# Patient Record
Sex: Female | Born: 1970 | Smoking: Never smoker
Health system: Southern US, Community
[De-identification: ages and names within clinical notes are randomized; demographics above are authoritative.]

## PROBLEM LIST (undated history)

## (undated) DIAGNOSIS — G4733 Obstructive sleep apnea (adult) (pediatric): Secondary | ICD-10-CM

## (undated) DIAGNOSIS — A6000 Herpesviral infection of urogenital system, unspecified: Secondary | ICD-10-CM

## (undated) HISTORY — DX: Obstructive sleep apnea (adult) (pediatric): G47.33

## (undated) HISTORY — PX: ABDOMINAL HYSTERECTOMY: SHX81

## (undated) HISTORY — DX: Herpesviral infection of urogenital system, unspecified: A60.00

---

## 2000-12-10 HISTORY — PX: ABLATION: SHX5711

## 2006-04-22 ENCOUNTER — Ambulatory Visit: Payer: Self-pay | Admitting: Obstetrics and Gynecology

## 2011-08-08 DIAGNOSIS — M545 Low back pain: Secondary | ICD-10-CM | POA: Insufficient documentation

## 2013-12-10 HISTORY — PX: GASTRIC BYPASS: SHX52

## 2014-04-28 ENCOUNTER — Ambulatory Visit: Payer: Self-pay | Admitting: Specialist

## 2014-04-28 DIAGNOSIS — E669 Obesity, unspecified: Secondary | ICD-10-CM

## 2014-04-28 DIAGNOSIS — Z01810 Encounter for preprocedural cardiovascular examination: Secondary | ICD-10-CM

## 2014-04-28 IMAGING — CR DG CHEST 2V
1 series · 2 of 2 positions shown · non-contrast
Comparison: None.

ADDENDUM:
In the clinical data line the term "more red" should be "morbid".
CLINICAL DATA: Pre bariatric procedure screening ; more red obesity

EXAM:
CHEST  2 VIEW

[Series 1: w chest pa · 0.14mm/px · 2 of 2 slices shown]
[im 1/2]
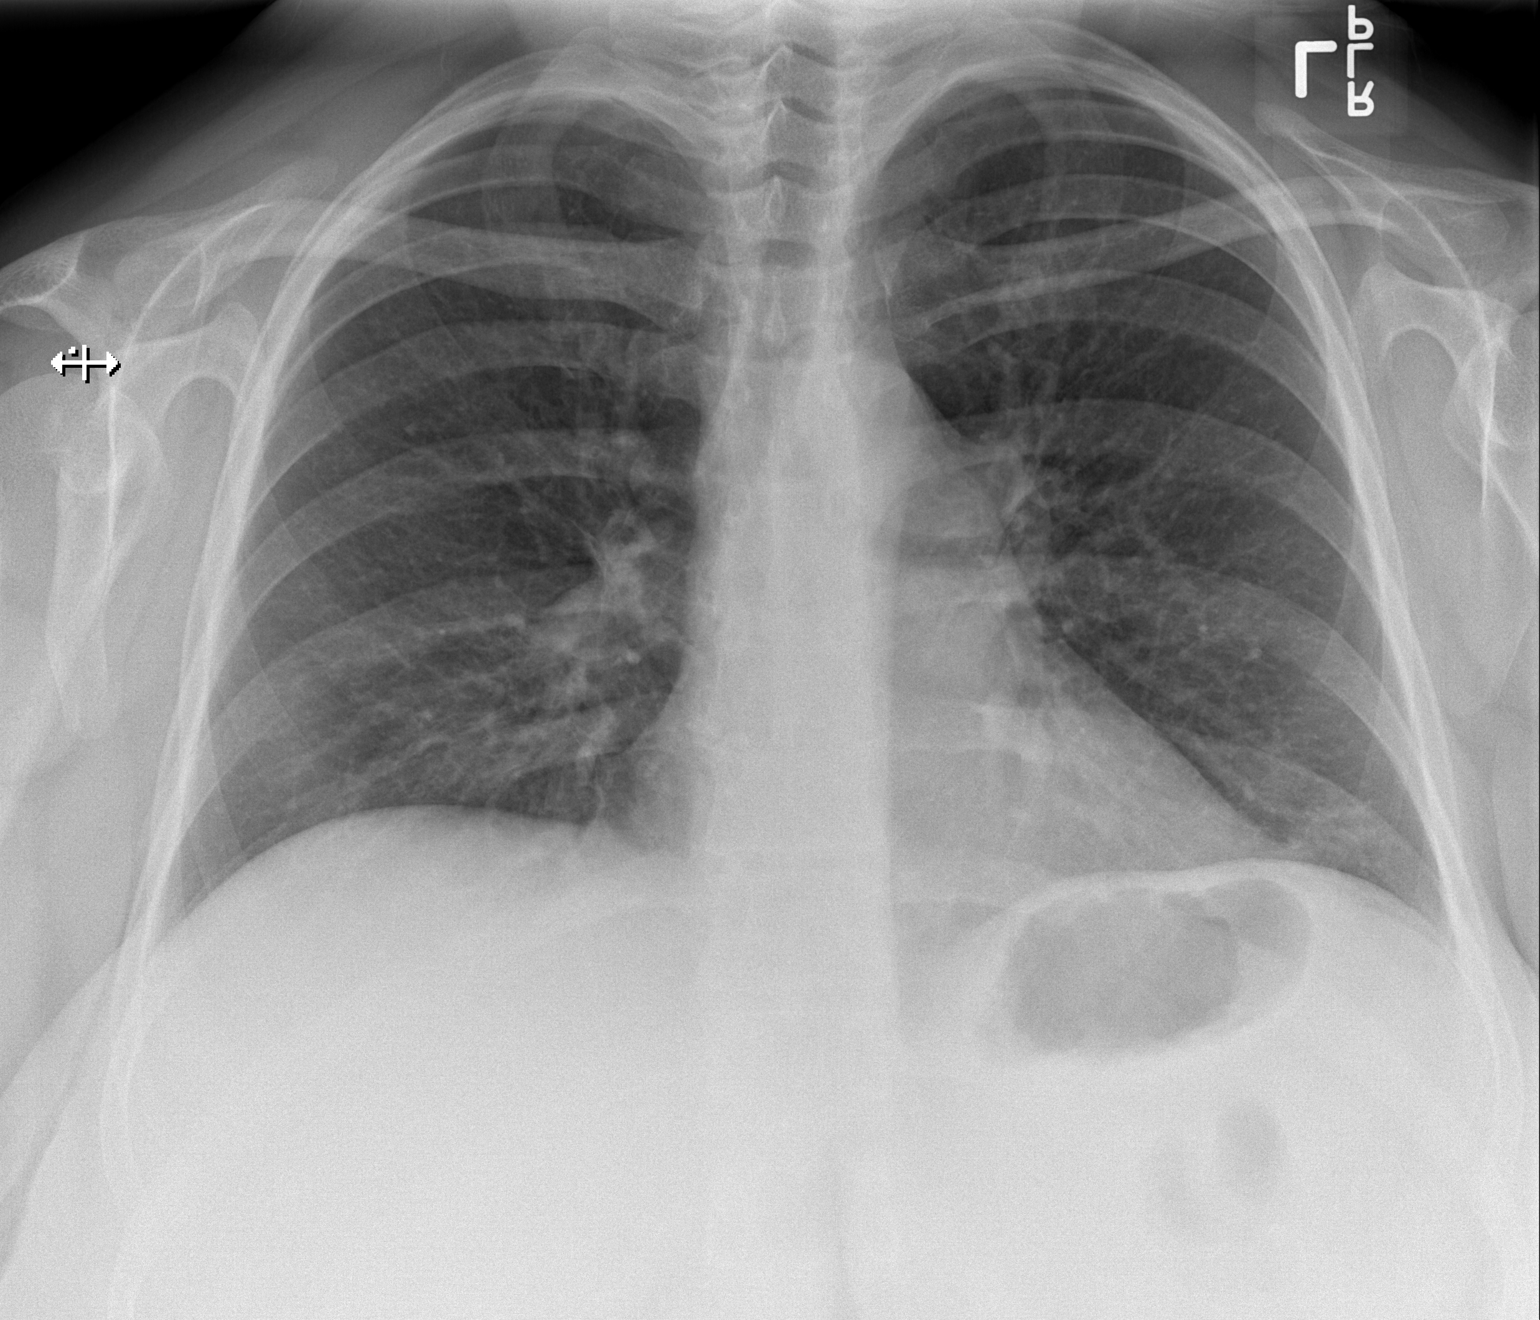
[im 2/2]
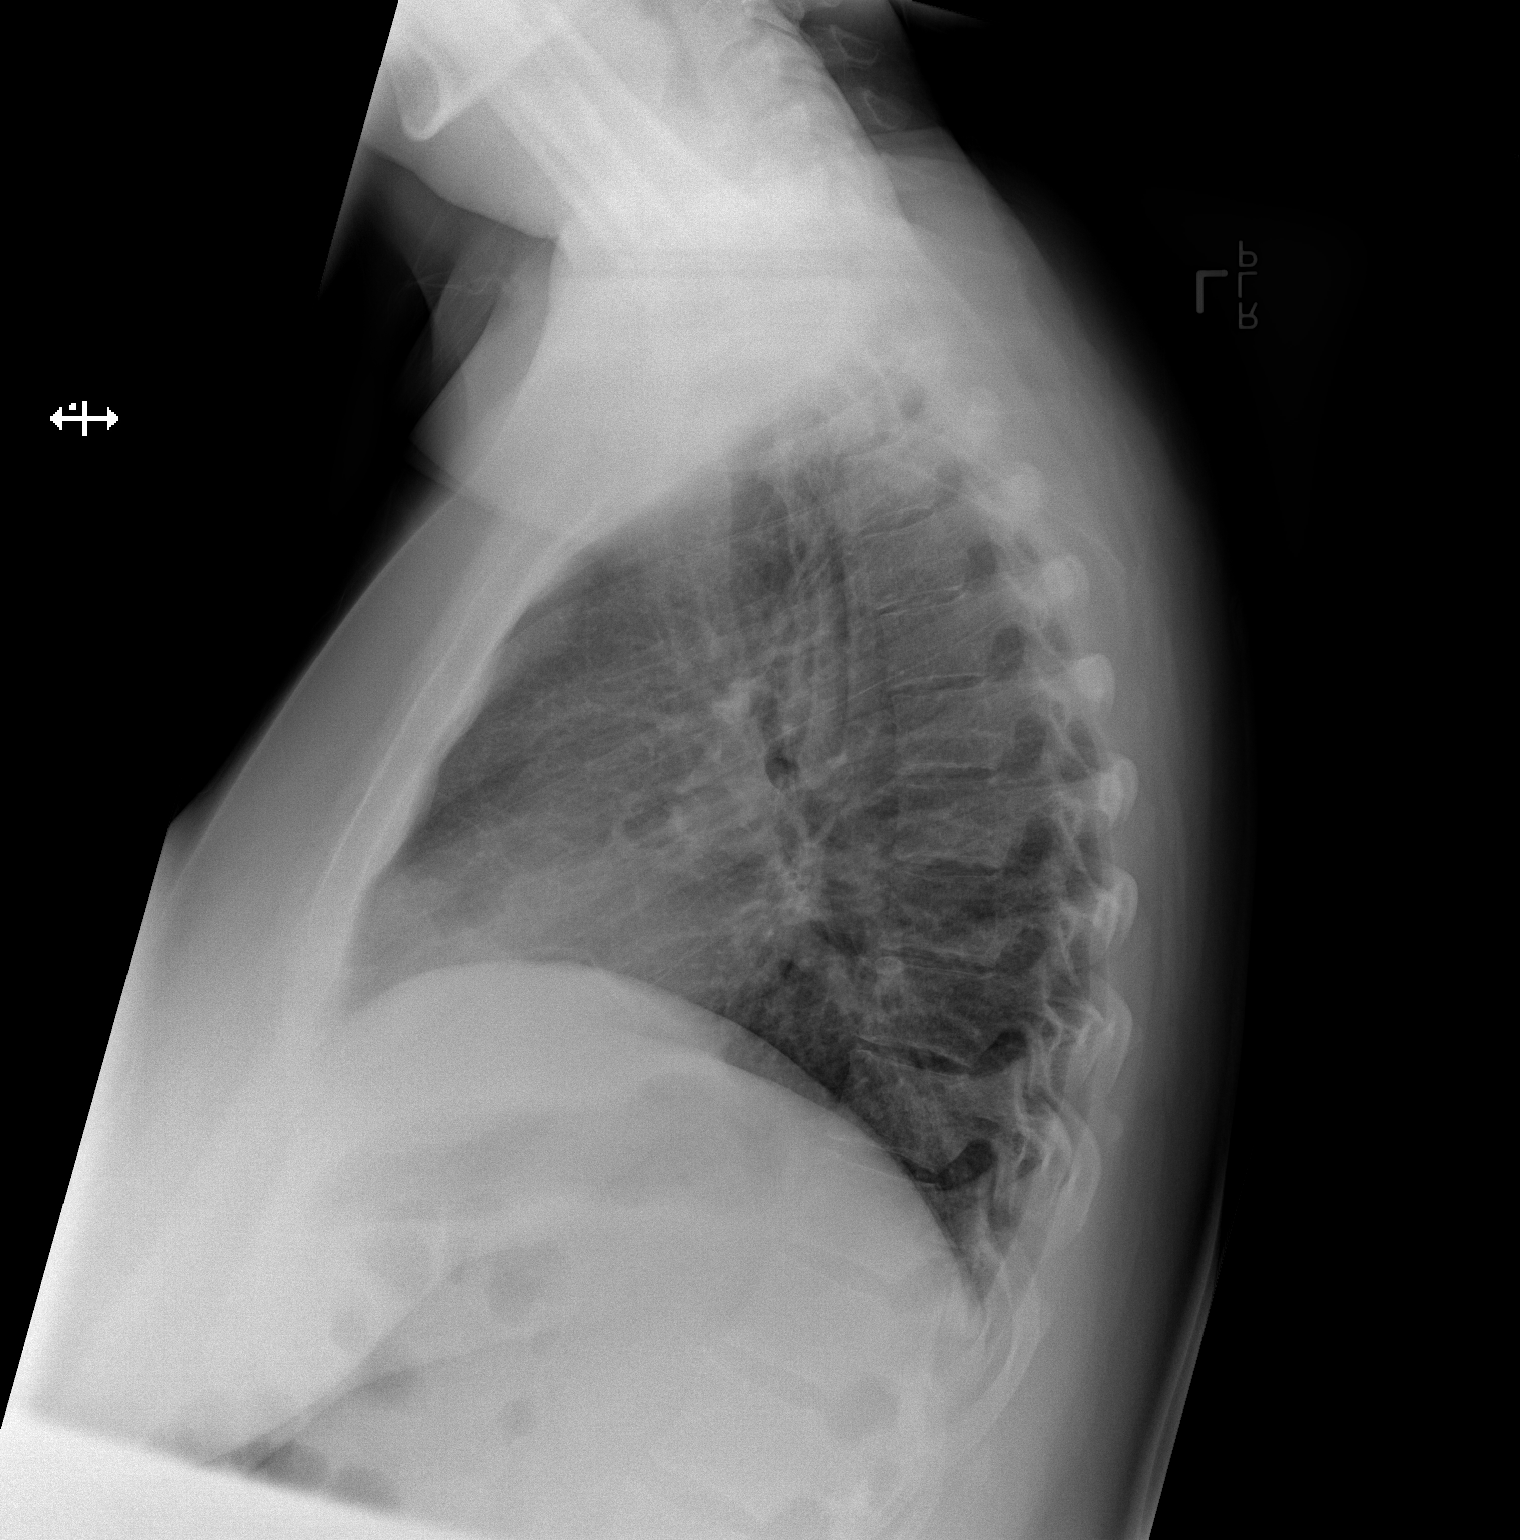

[2 of 2 positions shown; findings below may reference images not displayed]

FINDINGS: The heart size and mediastinal contours are within normal limits.
Both lungs are clear. The visualized skeletal structures are
unremarkable.
IMPRESSION: No active cardiopulmonary disease.

## 2014-07-19 DIAGNOSIS — F1020 Alcohol dependence, uncomplicated: Secondary | ICD-10-CM | POA: Insufficient documentation

## 2014-10-18 ENCOUNTER — Ambulatory Visit: Payer: Self-pay | Admitting: Specialist

## 2014-10-18 LAB — CBC WITH DIFFERENTIAL/PLATELET
BASOS ABS: 0 10*3/uL (ref 0.0–0.1)
BASOS PCT: 0.4 %
Eosinophil #: 0.1 10*3/uL (ref 0.0–0.7)
Eosinophil %: 0.7 %
HCT: 43 % (ref 35.0–47.0)
HGB: 14.4 g/dL (ref 12.0–16.0)
Lymphocyte #: 1.7 10*3/uL (ref 1.0–3.6)
Lymphocyte %: 20 %
MCH: 30.4 pg (ref 26.0–34.0)
MCHC: 33.5 g/dL (ref 32.0–36.0)
MCV: 91 fL (ref 80–100)
MONO ABS: 0.7 x10 3/mm (ref 0.2–0.9)
Monocyte %: 8.2 %
NEUTROS ABS: 6.1 10*3/uL (ref 1.4–6.5)
NEUTROS PCT: 70.7 %
Platelet: 388 10*3/uL (ref 150–440)
RBC: 4.73 10*6/uL (ref 3.80–5.20)
RDW: 12.7 % (ref 11.5–14.5)
WBC: 8.6 10*3/uL (ref 3.6–11.0)

## 2014-10-18 LAB — BASIC METABOLIC PANEL
Anion Gap: 8 (ref 7–16)
BUN: 21 mg/dL — ABNORMAL HIGH (ref 7–18)
CALCIUM: 9.3 mg/dL (ref 8.5–10.1)
CHLORIDE: 101 mmol/L (ref 98–107)
Co2: 27 mmol/L (ref 21–32)
Creatinine: 0.8 mg/dL (ref 0.60–1.30)
EGFR (African American): >60
EGFR (Non-African Amer.): >60
Glucose: 97 mg/dL (ref 65–99)
Osmolality: 275 (ref 275–301)
Potassium: 3.7 mmol/L (ref 3.5–5.1)
Sodium: 136 mmol/L (ref 136–145)

## 2014-10-26 ENCOUNTER — Inpatient Hospital Stay: Payer: Self-pay | Admitting: Specialist

## 2014-10-27 LAB — CBC WITH DIFFERENTIAL/PLATELET
Basophil #: 0 10*3/uL (ref 0.0–0.1)
Basophil %: 0.3 %
Eosinophil #: 0 10*3/uL (ref 0.0–0.7)
Eosinophil %: 0 %
HCT: 38.8 % (ref 35.0–47.0)
HGB: 13 g/dL (ref 12.0–16.0)
LYMPHS PCT: 2.6 %
Lymphocyte #: 0.4 10*3/uL — ABNORMAL LOW (ref 1.0–3.6)
MCH: 30.8 pg (ref 26.0–34.0)
MCHC: 33.4 g/dL (ref 32.0–36.0)
MCV: 92 fL (ref 80–100)
Monocyte #: 0.4 x10 3/mm (ref 0.2–0.9)
Monocyte %: 2.4 %
NEUTROS ABS: 14.6 10*3/uL — AB (ref 1.4–6.5)
Neutrophil %: 94.7 %
Platelet: 314 10*3/uL (ref 150–440)
RBC: 4.22 10*6/uL (ref 3.80–5.20)
RDW: 12.8 % (ref 11.5–14.5)
WBC: 15.4 10*3/uL — ABNORMAL HIGH (ref 3.6–11.0)

## 2014-10-27 LAB — BASIC METABOLIC PANEL
Anion Gap: 7 (ref 7–16)
BUN: 11 mg/dL (ref 7–18)
CALCIUM: 8.5 mg/dL (ref 8.5–10.1)
CREATININE: 1.52 mg/dL — AB (ref 0.60–1.30)
Chloride: 105 mmol/L (ref 98–107)
Co2: 24 mmol/L (ref 21–32)
Glucose: 151 mg/dL — ABNORMAL HIGH (ref 65–99)
Osmolality: 274 (ref 275–301)
Potassium: 4.7 mmol/L (ref 3.5–5.1)
SODIUM: 136 mmol/L (ref 136–145)

## 2014-10-27 LAB — ALBUMIN: Albumin: 3.5 g/dL (ref 3.4–5.0)

## 2014-10-27 LAB — PHOSPHORUS: Phosphorus: 2.9 mg/dL (ref 2.5–4.9)

## 2014-10-27 LAB — MAGNESIUM: Magnesium: 1.9 mg/dL

## 2014-12-10 HISTORY — PX: OOPHORECTOMY: SHX86

## 2015-01-25 DIAGNOSIS — N80129 Deep endometriosis of ovary, unspecified ovary: Secondary | ICD-10-CM | POA: Insufficient documentation

## 2015-01-25 DIAGNOSIS — N801 Endometriosis of ovary: Secondary | ICD-10-CM | POA: Insufficient documentation

## 2015-04-02 NOTE — Op Note (Signed)
PATIENT NAME:  Lauren Brady, DELELLIS MR#:  [id number] DATE OF BIRTH:  [birth year]  DATE OF PROCEDURE:  10/26/2014  PREOPERATIVE DIAGNOSIS:  Morbid obesity.   POSTOPERATIVE DIAGNOSES:  1.  Morbid obesity.  2.  Hiatal hernia.   PROCEDURE: Laparoscopic sleeve gastrectomy with hiatal hernia repair.   SURGEON: Kreg Shropshire, MD.   ASSISTANT:  Valaria Good, PA-C.   COMPLICATIONS: None.   ANESTHESIA: General endotracheal.   FINDINGS: A small to moderate hiatal hernia.   ESTIMATED BLOOD LOSS: None.   SPECIMENS: Portion of stomach.   CLINICAL HISTORY: See history and physical.   DETAILS OF PROCEDURE: The patient was taken to the operating room and placed on the operating table in the supine position with appropriate monitors. Supplemental oxygen and broad-spectrum antibiotics were administered. The patient was placed under general anesthesia without incident. The abdomen was prepped and draped in the usual sterile fashion. Timeout was performed. Broad-spectrum IV antibiotics were infused. The abdomen was accessed using the optical trocar in the left upper outer quadrant. Pneumoperitoneum was established without difficulty.  Multiple other trocars were placed in preparation for laparoscopic sleeve gastrectomy. The hiatus was explored after liver retractor was placed, a small to moderate hiatal hernia was present. This was closed using interrupted Surgidac sutures.  I then mobilized the greater curvature of the stomach from 5 cm from the pylorus all the way to the fundus. Fundus was fully mobilized off the left hemidiaphragm and posterior pancreatic attachments were taken down as well, completely freeing up the lateral portion of the stomach. A 36 French bougie was inserted down to the antrum and the antrum was bisected using the Echelon, bisected starting at 5 cm using reinforced Echelon green load stapler. Multiple fires of the gold load were then performed through the contralateral 12 mm port in the technique of  Hargroder.  This paralleled the lesser curvature all the way up to close to the angle of His.  The excess stomach was removed through the 15 mm port without spillage or bleeding. The staple line was completely reinforced, had no signs of bleeding or leak. The liver retractor was removed as well as the trocars and the wounds were closed using 4-0 Vicryl and Dermabond. The patient tolerated the procedure well and arrived to recovery room in stable condition.  She will be admitted overnight.     ____________________________ Kreg Shropshire, MD jb:bu D: 10/26/2014 20:10:05 ET T: 10/26/2014 20:43:14 ET JOB#: [id number]  cc: Kreg Shropshire, MD, <Dictator> Bonner Puna MD ELECTRONICALLY SIGNED 11/09/2014 15:02

## 2015-04-04 LAB — SURGICAL PATHOLOGY

## 2015-07-07 DIAGNOSIS — Z9884 Bariatric surgery status: Secondary | ICD-10-CM | POA: Insufficient documentation

## 2015-07-07 DIAGNOSIS — L659 Nonscarring hair loss, unspecified: Secondary | ICD-10-CM | POA: Insufficient documentation

## 2015-07-07 DIAGNOSIS — Z9889 Other specified postprocedural states: Secondary | ICD-10-CM | POA: Insufficient documentation

## 2015-07-07 DIAGNOSIS — E538 Deficiency of other specified B group vitamins: Secondary | ICD-10-CM | POA: Insufficient documentation

## 2016-09-26 ENCOUNTER — Ambulatory Visit: Payer: Self-pay | Admitting: Physical Therapy

## 2016-10-09 ENCOUNTER — Ambulatory Visit: Payer: Self-pay | Admitting: Physical Therapy

## 2016-10-26 ENCOUNTER — Encounter: Payer: Self-pay | Admitting: Physical Therapy

## 2016-10-26 ENCOUNTER — Ambulatory Visit: Payer: Commercial Managed Care - HMO | Attending: Obstetrics and Gynecology | Admitting: Physical Therapy

## 2016-10-26 DIAGNOSIS — M791 Myalgia, unspecified site: Secondary | ICD-10-CM

## 2016-10-26 DIAGNOSIS — R278 Other lack of coordination: Secondary | ICD-10-CM

## 2016-10-26 NOTE — Therapy (Addendum)
[place] MAIN [facility] SERVICES [address] [place], [place], [address] Phone: [phone]   Fax:  [phone]  Physical Therapy Evaluation  Patient Details  Name: Lauren Brady MRN: [id number] Date of Birth: [birth year] No Data Recorded  Encounter Date: 10/26/2016      PT End of Session - 10/26/16 1255    Visit Number 1   Number of Visits 12   Date for PT Re-Evaluation 01/18/17   PT Start Time 1104   PT Stop Time 1210   PT Time Calculation (min) 66 min      Past Medical History:  Diagnosis Date  . Genital herpes    managed with  medication  . Obstructive sleep apnea    currently uses a CPAP     Past Surgical History:  Procedure Laterality Date  . ABLATION  2002   lining of uterus removed 2/2 uncontrolled bleeding  . GASTRIC BYPASS  2015  . OOPHORECTOMY  2016   L removed due to a cyst     There were no vitals filed for this visit.       Subjective Assessment - 10/26/16 1118    Subjective Pt reported has pelvic pain 2-3/10 that causes difficulty with pelvic exams and sexual intercourse, constipation ( occuring one to every 3 days with Type 2-3 Bristol Stool Scale type), fecal urgency, and L low back mm pain. Pt has to provide full manual assistance with her father (300 lb) with bathing and also with yard work and grocery shopping for her parents. 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 Low back mm spasms occur at 5-6/10.     Pertinent History Hx of oophorectomy, uterine ablation. Genital herpes managed with medication             [facility] PT Assessment - 10/26/16 1128      Assessment   Medical Diagnosis spastic pelvic floor    Referring Provider --  Dr. Lanelle Bal     Precautions   Precautions None     Restrictions   Weight Bearing Restrictions No     Balance Screen   Has the patient fallen in the past 6 months No     Observation/Other Assessments   Other  Surveys  --  NIH-CPSI  30  %      Coordination   Gross Motor Movements are Fluid and Coordinated --  chest breathing     Squat   Comments narrow bos, anterior COM over knees     Posture/Postural Control   Posture Comments lumbopelvic perturbations with leg movements     AROM   Overall AROM Comments L rotation limited ~15%, R ~ 30%      PROM   Overall PROM Comments L hip IR ~ 30 deg with tight endfeel , R hip IR ~45 deg      Palpation   Spinal mobility significant hypomobility at T10-12, midback mm tensions    SI assessment  B PSIS tenderness with palpation in prone   Palpation comment 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   Abdomen soft, minimal scar restrictions                   [facility] Adult PT Treatment/Exercise - 10/26/16 1128      Therapeutic Activites    Therapeutic Activities --  see pt instructions     Manual Therapy   Manual therapy comments external releases at isciocavenosus through clothing, Grade  III PA mob T10-12 and STM along thoracic paraspinal mm                 PT Education - 10/26/16 1205    Education provided Yes   Education Details POC, anatomy, physiology, goals, HEP   Person(s) Educated Patient   Methods Explanation;Demonstration;Tactile cues;Verbal cues;Handout   Comprehension Returned demonstration;Verbalized understanding             PT Long Term Goals - 10/26/16 1130      PT LONG TERM GOAL #1   Title Pt will decrease her NIH-CPSI score from  30% to < 20 % in order to regain QOL and ADLs   Time 12   Period Weeks   Status New     PT LONG TERM GOAL #2   Title Pt will demo no pelvic floor mm tensions across 2 visits in order to tolerate pelvic exams    Time 12   Period Weeks   Status New     PT LONG TERM GOAL #3   Title Pt will report decreased LBP from 5-6/10 to < 3/10 by the end of day with manually assisting clients and father  in order to particiapte in work and home duties   Time 12   Period  Weeks   Status New     PT LONG TERM GOAL #4   Title Pt will report daily bowel movements instead of 1-3 days apart and with a stool consistency of Type 3-4 for 50% instead of Type 2-3 for 100% of time in order to preserve pelvic floor health   Time 12   Period Weeks   Status New               Plan - 10/26/16 1252    Clinical Impression Statement Pt is a 45 yo female who c/o pelvic pain, low back pain, and constipation. These deficits impact her QOL and ADLs. Pt's clinical presentations include overactive pelvic floor mm, increased mm tensions at the thoracic spine with limited spinal mobility, weak deep core mm, tenderness w/ palpation at B PSIS, and poor body mechanics with loaded activities. Pt showed increased spinal mobility and decreased mm tensions at her thoracic region and pelvic floor following today's Tx. Pt also showed improved coordination and increased pelvic floor ROM after training.    Clinical Impairments Affecting Rehab Potential     work and home duties involve lifting, pulling, pushing, and pt handling , Hx of abdominal surgeries   Rehab Potential Good   PT Frequency 1x / week   PT Duration 12 weeks   PT Treatment/Interventions ADLs/Self Care Home Management;Aquatic Therapy;Therapeutic activities;Functional mobility training;Therapeutic exercise;Stair training;Gait training;Balance training;Neuromuscular re-education;Patient/family education;Manual lymph drainage;Manual techniques;Scar mobilization;Taping;Moist Heat   Consulted and Agree with Plan of Care Patient      Patient will benefit from skilled therapeutic intervention in order to improve the following deficits and impairments:  Pain, Improper body mechanics, Increased muscle spasms, Decreased mobility, Decreased coordination, Postural dysfunction, Decreased endurance, Decreased range of motion, Decreased activity tolerance, Decreased safety awareness, Impaired flexibility, Hypomobility, Decreased  strength  Visit Diagnosis: Myalgia  Other lack of coordination     Problem List There are no active problems to display for this patient.   Jerl Mina ,PT, DPT, E-RYT  10/26/2016, 1:00 PM  [place] MAIN [facility] SERVICES [address] [place], [place], [address] Phone: [phone]   Fax:  [phone]  Name: Lauren Brady MRN: [id number] Date of Birth:  11/12/1971   

## 2016-10-26 NOTE — Patient Instructions (Signed)
You are now ready to begin training the deep core muscles system: diaphragm, transverse abdominis, pelvic floor . These muscles must work together as a team.           The key to these exercises to train the brain to coordinate the timing of these muscles and to have them turn on for long periods of time to hold you upright against gravity (especially important if you are on your feet all day).These muscles are postural muscles and play a role stabilizing your spine and bodyweight. By doing these repetitions slowly and correctly instead of doing crunches, you will achieve a flatter belly without a lower pooch. You are also placing your spine in a more neutral position and breathing properly which in turn, decreases your risk for problems related to your pelvic floor, abdominal, and low back such as pelvic organ prolapse, hernias, diastasis recti (separation of superficial muscles), disk herniations, spinal fractures. These exercises set a solid foundation for you to later progress to resistance/ strength training with therabands and weights and return to other typical fitness exercises with a stronger deeper core.   Do level 1 : 10 reps Do level 2: 10 reps (left and right = 1 rep) x 3 sets , 2 x day Do not progress to level 3 for 3-4 weeks. You know you are ready when you do not have any rocking of pelvis nor arching in your back    ______________  Open book (handout)  15 reps

## 2016-10-26 NOTE — Addendum Note (Signed)
Addended by: Jerl Mina on: 10/26/2016 03:53 PM   Modules accepted: Orders

## 2016-11-09 ENCOUNTER — Ambulatory Visit: Payer: Commercial Managed Care - HMO | Attending: Obstetrics and Gynecology | Admitting: Physical Therapy

## 2016-11-09 DIAGNOSIS — M791 Myalgia, unspecified site: Secondary | ICD-10-CM

## 2016-11-09 DIAGNOSIS — R278 Other lack of coordination: Secondary | ICD-10-CM | POA: Diagnosis present

## 2016-11-09 NOTE — Therapy (Addendum)
[place] MAIN [facility] SERVICES [address] [place], [place], [address] Phone: [phone]   Fax:  [phone]  Physical Therapy Treatment  Patient Details  Name: Lauren Brady MRN: [id number] Date of Birth: [birth year] No Data Recorded  Encounter Date: 11/09/2016      PT End of Session - 11/09/16 1014    Visit Number 2   Number of Visits 12   Date for PT Re-Evaluation 01/18/17   PT Start Time 0906   PT Stop Time 1010   PT Time Calculation (min) 64 min   Activity Tolerance Patient tolerated treatment well;No increased pain   Behavior During Therapy WFL for tasks assessed/performed      Past Medical History:  Diagnosis Date  . Genital herpes    managed with  medication  . Obstructive sleep apnea    currently uses a CPAP     Past Surgical History:  Procedure Laterality Date  . ABLATION  2002   lining of uterus removed 2/2 uncontrolled bleeding  . GASTRIC BYPASS  2015  . OOPHORECTOMY  2016   L removed due to a cyst     There were no vitals filed for this visit.      Subjective Assessment - 11/09/16 0912    Subjective Pt reports her back pain today is at 7/10.    Pertinent History Hx of oophorectomy, uterine ablation. Genital herpes managed with medication             [facility] PT Assessment - 11/09/16 0915      Coordination   Gross Motor Movements are Fluid and Coordinated --  excessive cuing for less chest breathing, lengthened breath      AROM   Overall AROM Comments L rotation limited ~15%, R ~ 30%       Palpation   Spinal mobility decreased hypomobility at thoracic spine (T10-12) post Tx, decreased mm tensions at L midback mm .    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              [facility] Adult PT Treatment/Exercise - 11/09/16 0958      Therapeutic Activites    Therapeutic Activities --  see pt instructions      Manual Therapy   Manual therapy comments to address pelvic obliquity: Lsidelying, long axis distraction LLE, infer/sup mob and MWM at L lateral border of sacrum   STM with MWM at thoracic mm, Grade III with MWM at T10-12                 PT Education - 11/09/16 1007    Education provided Yes   Education Details HEP   Person(s) Educated Patient   Methods Explanation;Demonstration;Tactile cues;Verbal cues;Handout   Comprehension Returned demonstration;Verbalized understanding             PT Long Term Goals - 10/26/16 1130      PT LONG TERM GOAL #1   Title Pt will decrease her NIH-CPSI score from  30% to < 20 % in order to regain QOL  and ADLs   Time 12   Period Weeks   Status New     PT LONG TERM GOAL #2   Title Pt will demo no pelvic floor mm tensions across 2 visits in order to tolerate pelvic exams    Time 12   Period Weeks   Status New     PT LONG TERM GOAL #3   Title Pt will report decreased LBP from 5-6/10 to < 3/10 by the end of day with manually assisting clients and father  in order to particiapte in work and home duties   Time 12   Period Weeks   Status New     PT LONG TERM GOAL #4   Title Pt will report daily bowel movements instead of 1-3 days apart and with a stool consistency of Type 3-4 for 50% instead of Type 2-3 for 100% of time in order to preserve pelvic floor health   Time 12   Period Weeks   Status New               Plan - 11/09/16 1008    Clinical Impression Statement Pt reported her lowback spasm does not feel as tight following manual Tx. Pain level decreased from 7/10 to 5/10 and pt showed increased spinal L rotation, thoraic mobility, and  arthrokinematic movements at L SIJ area following Tx.  Pt continues to benefit from skilled PT.    Rehab Potential Good   Clinical Impairments Affecting Rehab Potential work and home duties involve lifting, pulling, pushing, and pt handling , Hx of abdominal surgeries   PT Frequency 1x / week   PT Duration 12 weeks   PT Treatment/Interventions ADLs/Self Care Home Management;Aquatic Therapy;Therapeutic activities;Functional mobility training;Therapeutic exercise;Stair training;Gait training;Balance training;Neuromuscular re-education;Patient/family education;Manual lymph drainage;Manual techniques;Scar mobilization;Taping;Moist Heat   Consulted and Agree with Plan of Care Patient      Patient will benefit from skilled therapeutic intervention in order to improve the following deficits and impairments:  Pain, Improper body mechanics, Increased muscle spasms, Decreased mobility, Decreased coordination, Postural dysfunction, Decreased endurance, Decreased range of motion, Decreased activity tolerance, Decreased safety awareness, Impaired flexibility, Hypomobility, Decreased strength  Visit Diagnosis: Myalgia  Other lack of coordination     Problem List There are no active problems to display for this patient.   Jerl Mina 11/09/2016, 10:18 AM  [place] MAIN [facility] SERVICES [address] [place], [place], [address] Phone: [phone]   Fax:  [phone]  Name: Lauren Brady MRN: [id number] Date of Birth: 1970/12/20

## 2016-11-09 NOTE — Patient Instructions (Addendum)
Continue with open book 15 repsx night every night    Continue deep core level 2 , 30 reps x night every night    _______________________  Practice wider and longer stance in lunge (front knee bent over ankles) when assisting patients BREATHE  Practice not locking your knees    ADjust carseat forward a little so your heel can be planted on the floor when pressing pedal.  Fold towel lengthwise to  maintain neutral spine (neck curve above low back curve)  Keep both back of hips against towel/ seat when pressing gas pedal   __________  6 directions of the spine to loosen spine  at work

## 2016-11-16 ENCOUNTER — Ambulatory Visit: Payer: Commercial Managed Care - HMO | Admitting: Physical Therapy

## 2016-11-16 DIAGNOSIS — R278 Other lack of coordination: Secondary | ICD-10-CM

## 2016-11-16 DIAGNOSIS — M791 Myalgia, unspecified site: Secondary | ICD-10-CM

## 2016-11-16 NOTE — Patient Instructions (Signed)
Emphasize level 1 ( breathing with pelvic floor lengthening) less chest and more ribcage expansion  10 reps    ____________________ Deep core level 2    30 reps   ___________________  Yellow  band under heels while laying on back w/ knees bent  "W" exercise  10 reps x 2 sets  Hold band with thumbs point out - inhale and then exhale pull bands by bending elbows hands move in a "w"  (feel shoulder blades squeezing)

## 2016-11-16 NOTE — Therapy (Signed)
[place] MAIN [facility] SERVICES [address] [place], [place], [address] Phone: [phone]   Fax:  [phone]  Physical Therapy Treatment  Patient Details  Name: Lauren Brady MRN: [id number] Date of Birth: 19-Jun-1971 No Data Recorded  Encounter Date: 11/16/2016      PT End of Session - 11/16/16 1111    Visit Number 3   Number of Visits 12   Date for PT Re-Evaluation 01/18/17   PT Start Time 1108   PT Stop Time 1200   PT Time Calculation (min) 52 min      Past Medical History:  Diagnosis Date  . Genital herpes    managed with  medication  . Obstructive sleep apnea    currently uses a CPAP     Past Surgical History:  Procedure Laterality Date  . ABLATION  2002   lining of uterus removed 2/2 uncontrolled bleeding  . GASTRIC BYPASS  2015  . OOPHORECTOMY  2016   L removed due to a cyst     There were no vitals filed for this visit.      Subjective Assessment - 11/16/16 1109    Subjective Pt reported her back feels good and is not painful. Pt adjusted her car seat which helped alot. Pt also has been practicing proper pt handling strategies at work   Pertinent History Hx of oophorectomy, uterine ablation. Genital herpes managed with medication             [facility] PT Assessment - 11/16/16 1141      Palpation   Spinal mobility improved mobility at thoracic spine (T10-12) post Tx, decreased mm tensions at L midback mm .    SI assessment  NO tenderness to L PSIS and low back post Tx     Bed Mobility   Bed Mobility --  cued for log rolling                   Pelvic Floor Special Questions - 11/16/16 1139    Pelvic Floor Internal Exam pt consented verbally without contraindications   Exam Type Vaginal   Palpation increased tensions at 2-3rd layer without tenderness, R obt in/ iliococcygeus with more tensions > L    Strength weak squeeze, no lift  4/5 post Tx. improved pelvic floor lengthening with cues            [facility] Adult PT Treatment/Exercise - 11/16/16 1141      Therapeutic Activites    Therapeutic Activities --  see pt instructions     Manual Therapy   Manual therapy comments faciliation and thiele masage technique on 2-3 rd layers of  pelvic floor mm for optimal coordination and contraction                     PT Long Term Goals - 11/16/16 2249      PT LONG TERM GOAL #1   Title Pt will decrease her NIH-CPSI score from  30% to < 20 % in order to regain QOL and ADLs   Time 12   Period Weeks   Status On-going     PT LONG TERM GOAL #2   Title Pt will demo no pelvic floor mm tensions across 2 visits in order to tolerate pelvic exams    Time 12   Period Weeks   Status On-going     PT LONG TERM GOAL #3   Title Pt will report decreased LBP from 5-6/10 to <  3/10 by the end of day with manually assisting clients and father  in order to particiapte in work and home duties   Time 12   Period Weeks   Status On-going     PT LONG TERM GOAL #4   Title Pt will report daily bowel movements instead of 1-3 days apart and with a stool consistency of Type 3-4 for 50% instead of Type 2-3 for 100% of time in order to preserve pelvic floor health   Time 12   Period Weeks   Status On-going     PT LONG TERM GOAL #5   Title Pt will demo no pelvic floor mm tensions across 2 visits with improved pelvic floor  lengthening without cuing in order to restore pelvic floor function   Time 12   Period Weeks   Status New               Plan - 11/16/16 2247    Clinical Impression Statement Pt showed decrease back mm tensions compared to last session.  Today pt required internal manual Tx to decrease pelvic floor mm tensions. Following Tx, pt demo'd improved pelvic floor lengthening and coordination.  Initiated resistance band strengthening for scapular retraction today as well. Pt will continues to benefits from skilled PT.   Rehab Potential Good   Clinical Impairments  Affecting Rehab Potential work and home duties involve lifting, pulling, pushing, and pt handling , Hx of abdominal surgeries   PT Frequency 1x / week   PT Duration 12 weeks   PT Treatment/Interventions ADLs/Self Care Home Management;Aquatic Therapy;Therapeutic activities;Functional mobility training;Therapeutic exercise;Stair training;Gait training;Balance training;Neuromuscular re-education;Patient/family education;Manual lymph drainage;Manual techniques;Scar mobilization;Taping;Moist Heat   Consulted and Agree with Plan of Care Patient      Patient will benefit from skilled therapeutic intervention in order to improve the following deficits and impairments:  Pain, Improper body mechanics, Increased muscle spasms, Decreased mobility, Decreased coordination, Postural dysfunction, Decreased endurance, Decreased range of motion, Decreased activity tolerance, Decreased safety awareness, Impaired flexibility, Hypomobility, Decreased strength  Visit Diagnosis: Myalgia  Other lack of coordination     Problem List There are no active problems to display for this patient.   Jerl Mina ,PT, DPT, E-RYT  11/16/2016, 10:54 PM  [place] MAIN [facility] SERVICES [address] [place], [place], [address] Phone: [phone]   Fax:  [phone]  Name: Lauren Brady MRN: [id number] Date of Birth: 06/18/1971

## 2016-11-30 ENCOUNTER — Ambulatory Visit: Payer: Commercial Managed Care - HMO | Admitting: Physical Therapy

## 2016-11-30 DIAGNOSIS — M791 Myalgia, unspecified site: Secondary | ICD-10-CM

## 2016-11-30 DIAGNOSIS — R278 Other lack of coordination: Secondary | ICD-10-CM

## 2016-11-30 NOTE — Therapy (Addendum)
[place] MAIN [facility] SERVICES [address] [place], [place], [address] Phone: [phone]   Fax:  [phone]  Physical Therapy Treatment / Progress Note  Patient Details  Name: Lauren Brady MRN: [id number] Date of Birth: 1971/01/12 No Data Recorded  Encounter Date: 11/30/2016      PT End of Session - 11/30/16 1132    Visit Number 4   Number of Visits 12   Date for PT Re-Evaluation 01/18/17   PT Start Time 1050   PT Stop Time 1135   PT Time Calculation (min) 45 min   Activity Tolerance Patient tolerated treatment well;No increased pain   Behavior During Therapy WFL for tasks assessed/performed      Past Medical History:  Diagnosis Date  . Genital herpes    managed with  medication  . Obstructive sleep apnea    currently uses a CPAP     Past Surgical History:  Procedure Laterality Date  . ABLATION  2002   lining of uterus removed 2/2 uncontrolled bleeding  . GASTRIC BYPASS  2015  . OOPHORECTOMY  2016   L removed due to a cyst     There were no vitals filed for this visit.      Subjective Assessment - 11/30/16 1102    Subjective Pt reported she has started to have daily bowel movements. She feels 75% better with the pelvic/LBP pain   Pertinent History Hx of oophorectomy, uterine ablation. Genital herpes managed with medication                       Pelvic Floor Special Questions - 11/30/16 1126    Pelvic Floor Internal Exam pt consented verbally without contraindications   Exam Type Vaginal   Palpation no increased tensions, ROM achieved   Strength fair squeeze, definite lift           [facility] Adult PT Treatment/Exercise - 11/30/16 1127      Therapeutic Activites    Therapeutic Activities --  see pt instructions, reassessed goals                     PT Long Term Goals - 11/30/16 1102      PT LONG TERM GOAL #1   Title Pt will decrease her NIH-CPSI score from  30% to < 20 % in order  to regain QOL and  (12/22: 30%)   Time 12   Period Weeks   Status Achieved     PT LONG TERM GOAL #2   Title Pt will demo no pelvic floor mm tensions across 2 visits in order to tolerate pelvic exams    Time 12   Period Weeks   Status Achieved     PT LONG TERM GOAL #3   Title Pt will report decreased LBP from 5-6/10 to < 3/10 by the end of day with manually assisting clients and father  in order to particiapte in work and home duties   Time 12   Period Weeks   Status Achieved     PT LONG TERM GOAL #4   Title Pt will report daily bowel movements instead of 1-3 days apart and with a stool consistency of Type 3-4 for 50% instead of Type 2-3 for 100% of time in order to preserve pelvic floor health   Time 12   Period Weeks   Status Achieved     PT LONG TERM GOAL #5   Title Pt will  be able IND with bridging exercises with resistance bands and standing resistance band exercises in order to strengthen thoracolumbar strengthen to minimize relapse of pain when assisting patients and father in patient care.    Time 12   Period Weeks   Status On-going               Plan - 11/30/16 1134    Clinical Impression Statement Pt has achieved 4/5 goals and is close to [place] her last goal. Across the past 4 visits, pt has decreased pelvic floor mm tensions, improved pelvic floor movement with coordinated breathing, improved bowel movements, decreased LBP, and decreased back mm tensions. Pt showed improved body mechanics with driving and patient handling at work.  Pt is progressing towards outer core strengthening of her thoracolumbar mm system. Anticipate pt will achieve her remaining goal with skilled PT.    Rehab Potential Good   Clinical Impairments Affecting Rehab Potential work and home duties involve lifting, pulling, pushing, and pt handling , Hx of abdominal surgeries   PT Frequency 1x / week   PT Duration 12 weeks   PT Treatment/Interventions ADLs/Self Care Home Management;Aquatic  Therapy;Therapeutic activities;Functional mobility training;Therapeutic exercise;Stair training;Gait training;Balance training;Neuromuscular re-education;Patient/family education;Manual lymph drainage;Manual techniques;Scar mobilization;Taping;Moist Heat   Consulted and Agree with Plan of Care Patient      Patient will benefit from skilled therapeutic intervention in order to improve the following deficits and impairments:  Pain, Improper body mechanics, Increased muscle spasms, Decreased mobility, Decreased coordination, Postural dysfunction, Decreased endurance, Decreased range of motion, Decreased activity tolerance, Decreased safety awareness, Impaired flexibility, Hypomobility, Decreased strength  Visit Diagnosis: Myalgia  Other lack of coordination     Problem List There are no active problems to display for this patient.   Jerl Mina ,PT, DPT, E-RYT  11/30/2016, 11:42 AM  [place] MAIN [facility] SERVICES [address] [place], [place], [address] Phone: [phone]   Fax:  [phone]  Name: Lauren Brady MRN: [id number] Date of Birth: [birth year]

## 2016-11-30 NOTE — Patient Instructions (Signed)
Bridging series w/ resistive band other side of doorknob:  Level 1:  Position:  Elbows bent, knees hip width apart,  Stabilization points: shoulders, upper arms, back of head pressed into floor. Heel press downward.   Movement: inhale do nothing, exhale pull band by side, lower fists to floor completely while lifting hips.Keep stabilization points engaged when you allow the band to go back to starting position  10 x 2 reps       Level 2:  Position:  Elbows straight, arms raised to ceiling at shoulder height, knees apart like a ballerina,heels together  Stabilization points: shoulders, upper arms, back of head pressed into floor. Heel press downward.   Movement: inhale do nothing, exhale pull band by side, lower fists to floor completely while lifting hips. Keep stabilization points engaged when you allow the band to go back to starting position   10 x 2 reps  Shoulder training: Imagine you are squeezing a pencil under your armpit and your shoulder blades are down away from your ears and towards each other

## 2016-12-14 ENCOUNTER — Ambulatory Visit: Payer: Commercial Managed Care - HMO | Attending: Obstetrics and Gynecology | Admitting: Physical Therapy

## 2016-12-14 DIAGNOSIS — R278 Other lack of coordination: Secondary | ICD-10-CM | POA: Diagnosis present

## 2016-12-14 DIAGNOSIS — M791 Myalgia, unspecified site: Secondary | ICD-10-CM

## 2016-12-14 NOTE — Patient Instructions (Addendum)
Red band exercises:  Lat pull down Red band on other side of the door knob Squeeze shoulder blades together first,  Inhale, exhale, pull bands past your pocket without lifting shoulders up  10 x 2 right foot forward,   10 x 2 left foot forward  Feet hip width apart        Multifidis twist: Stand with red band at doorknob  Perpendicular to the door   starting position sumo stance, shoulder blades roll back and down like squeezing a pencil under armpit 10 x 2 each side       Tricep Stand with band under feet , knees unlocked Hand behind head, elbows pointed outward Inhale  Exhale straighten your elbows to bring fists upward  10 x 2      Sidestepping squat  Red band at the thighs  Along a hallway 10 ft  Left and right

## 2016-12-14 NOTE — Therapy (Signed)
[place] MAIN [facility] SERVICES [address] [place], [place], [address] Phone: [phone]   Fax:  [phone]  Physical Therapy Treatment  Patient Details  Name: Lauren Brady MRN: [id number] Date of Birth: 12-27-1970 No Data Recorded  Encounter Date: 12/14/2016      PT End of Session - 12/14/16 1132    Visit Number 5   Number of Visits 12   Date for PT Re-Evaluation 01/18/17   PT Start Time 1100   PT Stop Time 1145   PT Time Calculation (min) 45 min   Activity Tolerance Patient tolerated treatment well;No increased pain   Behavior During Therapy WFL for tasks assessed/performed      Past Medical History:  Diagnosis Date  . Genital herpes    managed with  medication  . Obstructive sleep apnea    currently uses a CPAP     Past Surgical History:  Procedure Laterality Date  . ABLATION  2002   lining of uterus removed 2/2 uncontrolled bleeding  . GASTRIC BYPASS  2015  . OOPHORECTOMY  2016   L removed due to a cyst     There were no vitals filed for this visit.      Subjective Assessment - 12/14/16 1109    Subjective Pt reported her back felt stiff after working 8 days but the pain was not present. Pt performs her stretches during the work hours and she also adjusts her standing and movements to help her back.    Pertinent History Hx of oophorectomy, uterine ablation. Genital herpes managed with medication             [facility] PT Assessment - 12/14/16 1115      Coordination   Gross Motor Movements are Fluid and Coordinated --  cued for scap retraction with lat pulls to decrease upper trap,  Excessive tactile cues for trunk /pelvic dissassociation coordination                     [facility] Adult PT Treatment/Exercise - 12/14/16 1116      Therapeutic Activites    Therapeutic Activities --  see pt instructions     Neuro Re-ed    Neuro Re-ed Details  see pt isntructions ( trunk pelvis dissassociation)                  PT Education - 12/14/16 [id number]    Education provided Yes   Education Details HEP   Person(s) Educated Patient   Methods Explanation;Demonstration;Tactile cues;Verbal cues;Handout   Comprehension Returned demonstration;Verbalized understanding             PT Long Term Goals - 11/30/16 1102      PT LONG TERM GOAL #1   Title Pt will decrease her NIH-CPSI score from  30% to < 20 % in order to regain QOL and  (12/22: 30%)   Time 12   Period Weeks   Status Achieved     PT LONG TERM GOAL #2   Title Pt will demo no pelvic floor mm tensions across 2 visits in order to tolerate pelvic exams    Time 12   Period Weeks   Status Achieved     PT LONG TERM GOAL #3   Title Pt will report decreased LBP from 5-6/10 to < 3/10 by the end of day with manually assisting clients and father  in order to particiapte in work and home duties   Time 12   Period  Weeks   Status Achieved     PT LONG TERM GOAL #4   Title Pt will report daily bowel movements instead of 1-3 days apart and with a stool consistency of Type 3-4 for 50% instead of Type 2-3 for 100% of time in order to preserve pelvic floor health   Time 12   Period Weeks   Status Achieved     PT LONG TERM GOAL #5   Title Pt will be able IND with bridging exercises with resistance bands and standing resistance band exercises in order to strengthen thoracolumbar strengthen to minimize relapse of pain when assisting patients and father in patient care.    Time 12   Period Weeks   Status On-going               Plan - 12/14/16 1915    Clinical Impression Statement Pt is progressing well with resistance training to strnegthen outer core, thoracolumbar, and scapular mm. Pt required minor cuing to decrease upper trap overuse and had a difficult time with dissassociating trunk and pelvis movements. Pt will continue to benefit from skilled PT.   Rehab Potential Good   Clinical Impairments Affecting Rehab Potential work  and home duties involve lifting, pulling, pushing, and pt handling , Hx of abdominal surgeries   PT Frequency 1x / week   PT Duration 12 weeks   PT Treatment/Interventions ADLs/Self Care Home Management;Aquatic Therapy;Therapeutic activities;Functional mobility training;Therapeutic exercise;Stair training;Gait training;Balance training;Neuromuscular re-education;Patient/family education;Manual lymph drainage;Manual techniques;Scar mobilization;Taping;Moist Heat   Consulted and Agree with Plan of Care Patient      Patient will benefit from skilled therapeutic intervention in order to improve the following deficits and impairments:  Pain, Improper body mechanics, Increased muscle spasms, Decreased mobility, Decreased coordination, Postural dysfunction, Decreased endurance, Decreased range of motion, Decreased activity tolerance, Decreased safety awareness, Impaired flexibility, Hypomobility, Decreased strength  Visit Diagnosis: Myalgia  Other lack of coordination     Problem List There are no active problems to display for this patient.   Jerl Mina ,PT, DPT, E-RYT  12/14/2016, 7:17 PM  [place] MAIN [facility] SERVICES [address] [place], [place], [address] Phone: [phone]   Fax:  [phone]  Name: Lauren Brady MRN: [id number] Date of Birth: 1971-12-04

## 2016-12-28 ENCOUNTER — Encounter: Payer: Self-pay | Admitting: Physical Therapy

## 2017-01-04 ENCOUNTER — Encounter: Payer: Self-pay | Admitting: Physical Therapy

## 2017-01-04 NOTE — Therapy (Signed)
[place] MAIN [facility] SERVICES [address] [place], [place], [address] Phone: [phone]   Fax:  [phone]  Patient Details  Name: Lauren Brady MRN: [id number] Date of Birth: [birth year] Referring Provider:  Dr. Lanelle Bal   Encounter Date: 01/04/2017  Discharge Summary   Pt has achieved 100%  of her goals across 5 visits and reports her pelvic pain and LBP have improved "Quite a bit better" since [facility] based on the GROC scale. Pt has shown significantly decreased pelvic floor and back mm tensions, improved deep core coordination, and body mechanics with toileting and work activities. Pt's bowel movements have improved in stool consistency and frequency. Pt has regained the ability to engage in sexual intercourse and working long hours without pelvic nor low back pain. Pt is ready for d/c. Thank you for your referral!        PT Long Term Goals - 11/30/16 1102      PT LONG TERM GOAL #1   Title Pt will decrease her NIH-CPSI score from  30% to < 20 % in order to regain QOL and  (12/22: 30%)   Time 12   Period Weeks   Status Achieved     PT LONG TERM GOAL #2   Title Pt will demo no pelvic floor mm tensions across 2 visits in order to tolerate pelvic exams    Time 12   Period Weeks   Status Achieved     PT LONG TERM GOAL #3   Title Pt will report decreased LBP from 5-6/10 to < 3/10 by the end of day with manually assisting clients and father  in order to particiapte in work and home duties   Time 12   Period Weeks   Status Achieved     PT LONG TERM GOAL #4   Title Pt will report daily bowel movements instead of 1-3 days apart and with a stool consistency of Type 3-4 for 50% instead of Type 2-3 for 100% of time in order to preserve pelvic floor health   Time 12   Period Weeks   Status Achieved     PT LONG TERM GOAL #5   Title Pt will be able IND with bridging exercises with resistance bands and standing resistance band exercises in order to  strengthen thoracolumbar strengthen to minimize relapse of pain when assisting patients and father in patient care.    Time 12   Period Weeks   Status Achieved        Jerl Mina ,PT, DPT, E-RYT  01/04/2017, 10:56 AM  [place] MAIN [facility] SERVICES [address] [place], [place], [address] Phone: [phone]   Fax:  [phone]

## 2017-01-30 ENCOUNTER — Encounter: Payer: 59 | Admitting: Physical Therapy

## 2017-07-11 ENCOUNTER — Other Ambulatory Visit: Payer: Self-pay | Admitting: Advanced Practice Midwife

## 2017-07-11 ENCOUNTER — Encounter: Payer: Self-pay | Admitting: Advanced Practice Midwife

## 2017-07-11 ENCOUNTER — Ambulatory Visit (INDEPENDENT_AMBULATORY_CARE_PROVIDER_SITE_OTHER): Payer: Managed Care, Other (non HMO) | Admitting: Advanced Practice Midwife

## 2017-07-11 VITALS — BP 124/78 | Ht 63.0 in | Wt 180.0 lb

## 2017-07-11 DIAGNOSIS — E559 Vitamin D deficiency, unspecified: Secondary | ICD-10-CM | POA: Insufficient documentation

## 2017-07-11 DIAGNOSIS — N390 Urinary tract infection, site not specified: Secondary | ICD-10-CM | POA: Diagnosis not present

## 2017-07-11 DIAGNOSIS — E611 Iron deficiency: Secondary | ICD-10-CM | POA: Insufficient documentation

## 2017-07-11 DIAGNOSIS — G4733 Obstructive sleep apnea (adult) (pediatric): Secondary | ICD-10-CM | POA: Insufficient documentation

## 2017-07-11 DIAGNOSIS — K449 Diaphragmatic hernia without obstruction or gangrene: Secondary | ICD-10-CM | POA: Insufficient documentation

## 2017-07-11 MED ORDER — CEPHALEXIN 500 MG PO CAPS: 500.0000 mg | ORAL_CAPSULE | Freq: Two times a day (BID) | ORAL | 0 refills | Status: DC

## 2017-07-12 NOTE — Progress Notes (Signed)
S: The patient is here today for complaint of lower abdominal pain. About a week ago she thought she had a UTI and called her PCP for medication. She took an antibiotic but wasn't sure which one. She then developed itching, irritation and discharge and requested medication for yeast infection. Following that she had intercourse a few days ago followed by spotting blood for a day when wiping and since then has had lower abdominal pain. She has a history of uterine ablation 15 years ago.   O: Vital Signs: BP 124/78   Ht 5\' 3"  (1.6 m)   Wt 180 lb (81.6 kg)   LMP 07/08/2017   BMI 31.89 kg/m  Constitutional: Well nourished, well developed female in no acute distress.  HEENT: normal Skin: Warm and dry.  Cardiovascular: Regular rate and rhythm.   Respiratory: Clear to auscultation bilateral. Normal respiratory effort Abdomen: soft, mildly tender to palpation, no abnormal masses, no epigastric pain Back: no CVAT Psych: Alert and Oriented x3. No memory deficits. Normal mood and affect.  MS: normal gait, normal bilateral lower extremity ROM/strength/stability.  Pelvic exam:  is not limited by body habitus EGBUS: within normal limits Vagina: within normal limits and with normal mucosa, wet prep done, no blood in the vault Cervix: not evaluated  Wet Prep: negative for yeast, clue cells, whiff test  Urine dip: positive for small amount of leukocytes otherwise negative  A: 46 y.o. Female with acute lower abdominal pain, possible UTI   P: Urine culture Rx Keflex 7 days F/U PRN u/s for unresolved pain/or recurrence of bleeding  Rod Can, CNM

## 2017-07-15 LAB — URINE CULTURE: ORGANISM ID, BACTERIA: NO GROWTH

## 2021-10-16 ENCOUNTER — Other Ambulatory Visit: Payer: Self-pay | Admitting: Family Medicine

## 2022-01-18 ENCOUNTER — Encounter: Payer: Self-pay | Admitting: Gastroenterology

## 2022-01-18 ENCOUNTER — Other Ambulatory Visit: Payer: Self-pay

## 2022-01-18 ENCOUNTER — Ambulatory Visit (INDEPENDENT_AMBULATORY_CARE_PROVIDER_SITE_OTHER): Payer: BC Managed Care – PPO | Admitting: Gastroenterology

## 2022-01-18 VITALS — BP 113/80 | HR 90 | Temp 97.9°F | Ht 63.0 in | Wt 222.0 lb

## 2022-01-18 DIAGNOSIS — R1030 Lower abdominal pain, unspecified: Secondary | ICD-10-CM | POA: Diagnosis not present

## 2022-01-18 MED ORDER — NA SULFATE-K SULFATE-MG SULF 17.5-3.13-1.6 GM/177ML PO SOLN: 1.0000 | Freq: Once | ORAL | 0 refills | Status: AC

## 2022-01-18 MED ORDER — CYCLOBENZAPRINE HCL 10 MG PO TABS: 10.0000 mg | ORAL_TABLET | Freq: Three times a day (TID) | ORAL | 1 refills | Status: AC | PRN

## 2022-01-18 NOTE — Progress Notes (Signed)
Gastroenterology Consultation  Referring Provider:     Lynnell Jude, MD Primary Care Physician:  Lynnell Jude, MD Primary Gastroenterologist:  Dr. Allen Norris     Reason for Consultation:     Abdominal pain        HPI:   Lauren Brady is a 51 y.o. y/o female referred for consultation & management of abdominal pain by Dr. Clemmie Krill, Lynnell Jude, MD. This patient comes to see me for abdominal pain.  The patient was seen earlier this year for the same pain by OB/GYN.  Also appears that the patient had abdominal pain back in 2018 when being seen by gynecology.  When she saw her gynecologist she was reporting left-sided abdominal pain and pelvic pain.  The patient has a history of endometriosis and had seen Dr. Delfina Redwood for history of chronic pelvic pain.  At her appointment on January 18 of this year the patient was reporting that the current pain had been present for approximately 3 weeks at that time.  She reported to be constant and achy in nature.  She was reporting that nothing made it worse or better and was a 2 out of 10 during the visit but a 10 out of 10 at times.  She noted that it felt like the same pain she had when she had an ovarian cyst in the past.  On chart review it also shows that the patient had an ERCP at [facility] for CBD stone in 2018. She had a sleeve gastrectomy 2015. She says it hurts when she lays on it.  She also reports that the pain is not made any better or worse with eating nor does she have any change in bowel habits or unexplained weight loss.  There is no report of any black stools or bloody stools.  The patient has never had a screening colonoscopy in the past.  Past Medical History:  Diagnosis Date   Genital herpes    managed with  medication   Obstructive sleep apnea    currently uses a CPAP     Past Surgical History:  Procedure Laterality Date   ABLATION  2002   lining of uterus removed 2/2 uncontrolled bleeding   GASTRIC BYPASS  2015    OOPHORECTOMY  2016   L removed due to a cyst     Prior to Admission medications   Medication Sig Start Date End Date Taking? Authorizing Provider  cephALEXin (KEFLEX) 500 MG capsule Take 1 capsule (500 mg total) by mouth 2 (two) times daily. 07/11/17   Rod Can, CNM  Cholecalciferol (VITAMIN D3) 2000 units capsule Take by mouth.    [provider]  cyclobenzaprine (FLEXERIL) 10 MG tablet cyclobenzaprine 10 mg tablet    [provider]  diazepam (VALIUM) 5 MG tablet Insert 1 tablet PER VAGINA nightly for treatment of pelvic floor muscle spasm. 09/10/16   [provider]  fluconazole (DIFLUCAN) 100 MG tablet Take 100 mg by mouth daily. 07/03/17   [provider]  HYDROcodone-acetaminophen (NORCO) 10-325 MG tablet hydrocodone 10 mg-acetaminophen 325 mg tablet    [provider]  HYDROcodone-acetaminophen (NORCO) 10-325 MG tablet Take by mouth.    [provider]  HYDROcodone-acetaminophen (NORCO) 10-325 MG tablet Take by mouth.    [provider]  meloxicam (MOBIC) 15 MG tablet meloxicam 15 mg tablet  TAKE 1 TABLET BY MOUTH EVERY DAY    [provider]  naproxen (NAPROSYN) 500 MG tablet naproxen 500 mg  tablet  TAKE 1 TABLET(S) TWICE A DAY BY ORAL ROUTE AS NEEDED.    [provider]  omeprazole (PRILOSEC) 20 MG capsule Take 20 mg by mouth daily.    [provider]  oxyCODONE (OXY IR/ROXICODONE) 5 MG immediate release tablet TAKE 1 TABLET (5 MG TOTAL) BY MOUTH EVERY SIX (6) HOURS AS NEEDED FOR PAIN. FOR UP TO 5 DAYS 04/24/17   [provider]  Phentermine-Topiramate (QSYMIA) 3.75-23 MG CP24 Qsymia 3.75 mg-23 mg capsule, extended release  TAKE ONE CAPSULE BY MOUTH EVERY DAY    [provider]  topiramate (TOPAMAX) 25 MG tablet topiramate 25 mg tablet  1-2 po 2 hrs before bed time x 1 week then 3 po hs x 1 week then 1 po am and 3 po hs    [provider]  traMADol (ULTRAM) 50 MG  tablet TAKE 1 TABLET (50 MG TOTAL) BY MOUTH EVERY SIX (6) HOURS AS NEEDED FOR PAIN. 05/02/17   [provider]  valACYclovir (VALTREX) 500 MG tablet Take 500 mg by mouth 2 (two) times daily.    [provider]    Family History  Problem Relation Age of Onset   Diabetes Mother    Obesity Mother    Cancer Father    Diabetes Father    Heart disease Father    Hyperlipidemia Father      Social History   Tobacco Use   Smoking status: Never   Smokeless tobacco: Never  Substance Use Topics   Alcohol use: No   Drug use: No    Allergies as of 01/18/2022 - Review Complete 07/11/2017  Allergen Reaction Noted   Midol [acetaminophen]  10/26/2016    Review of Systems:    All systems reviewed and negative except where noted in HPI.   Physical Exam:  There were no vitals taken for this visit. No LMP recorded. General:   Alert,  Well-developed, well-nourished, pleasant and cooperative in NAD Head:  Normocephalic and atraumatic. Eyes:  Sclera clear, no icterus.   Conjunctiva pink. Ears:  Normal auditory acuity. Neck:  Supple; no masses or thyromegaly. Lungs:  Respirations even and unlabored.  Clear throughout to auscultation.   No wheezes, crackles, or rhonchi. No acute distress. Heart:  Regular rate and rhythm; no murmurs, clicks, rubs, or gallops. Abdomen:  Normal bowel sounds.  No bruits.  Soft, mild tenderness with 1 finger palpation while flexing the abdominal wall muscles and non-distended without masses, hepatosplenomegaly or hernias noted.  No guarding or rebound tenderness.  Positive Carnett sign.   Rectal:  Deferred.  Pulses:  Normal pulses noted. Extremities:  No clubbing or edema.  No cyanosis. Neurologic:  Alert and oriented x3;  grossly normal neurologically. Skin:  Intact without significant lesions or rashes.  No jaundice. Lymph Nodes:  No significant cervical adenopathy. Psych:  Alert and cooperative. Normal mood and affect.  Imaging Studies: No  results found.  Assessment and Plan:   Lauren Brady is a 51 y.o. y/o female who comes in today with a history of abdominal pain with the abdominal pain being reproducible with lifting the patient's legs above the exam table.  The patient has been told the musculoskeletal nature of the abdominal and will be started on Flexeril.  She will use warm compresses to the area and has been told that she cannot take NSAIDs due to her gastric bypass.  The patient will also be set up for screening colonoscopy.  The patient has been explained the plan  and agree with it.    Lucilla Lame, MD. Marval Regal    Note: This dictation was prepared with Dragon dictation along with smaller phrase technology. Any transcriptional errors that result from this process are unintentional.

## 2022-02-08 ENCOUNTER — Encounter: Payer: Self-pay | Admitting: Gastroenterology

## 2022-02-08 ENCOUNTER — Other Ambulatory Visit: Payer: Self-pay

## 2022-02-08 DIAGNOSIS — Z1211 Encounter for screening for malignant neoplasm of colon: Secondary | ICD-10-CM

## 2022-02-09 ENCOUNTER — Other Ambulatory Visit: Payer: Self-pay

## 2022-02-09 ENCOUNTER — Ambulatory Visit: Payer: BC Managed Care – PPO | Admitting: Anesthesiology

## 2022-02-09 ENCOUNTER — Encounter
Admission: RE | Disposition: A | Discharge status: Home / Self Care | Payer: Self-pay | Source: Ambulatory Visit | Attending: Gastroenterology

## 2022-02-09 ENCOUNTER — Ambulatory Visit
Admission: RE | Admit: 2022-02-09 | Discharge: 2022-02-09 | Disposition: A | Discharge status: Home / Self Care | Payer: BC Managed Care – PPO | Source: Ambulatory Visit | Attending: Gastroenterology | Admitting: Gastroenterology

## 2022-02-09 ENCOUNTER — Encounter: Payer: Self-pay | Admitting: Gastroenterology

## 2022-02-09 DIAGNOSIS — Z1211 Encounter for screening for malignant neoplasm of colon: Secondary | ICD-10-CM

## 2022-02-09 DIAGNOSIS — D120 Benign neoplasm of cecum: Secondary | ICD-10-CM | POA: Diagnosis not present

## 2022-02-09 DIAGNOSIS — Z9884 Bariatric surgery status: Secondary | ICD-10-CM | POA: Diagnosis not present

## 2022-02-09 DIAGNOSIS — K635 Polyp of colon: Secondary | ICD-10-CM | POA: Diagnosis not present

## 2022-02-09 DIAGNOSIS — G4733 Obstructive sleep apnea (adult) (pediatric): Secondary | ICD-10-CM | POA: Insufficient documentation

## 2022-02-09 DIAGNOSIS — K640 First degree hemorrhoids: Secondary | ICD-10-CM | POA: Diagnosis not present

## 2022-02-09 DIAGNOSIS — K6389 Other specified diseases of intestine: Secondary | ICD-10-CM | POA: Insufficient documentation

## 2022-02-09 HISTORY — PX: POLYPECTOMY: SHX5525

## 2022-02-09 HISTORY — PX: COLONOSCOPY: SHX5424

## 2022-02-09 SURGERY — COLONOSCOPY
Anesthesia: General | Site: Rectum

## 2022-02-09 MED ORDER — LACTATED RINGERS IV SOLN: INTRAVENOUS | Status: DC | PRN

## 2022-02-09 MED ORDER — SODIUM CHLORIDE 0.9 % IV SOLN: INTRAVENOUS | Status: DC

## 2022-02-09 MED ORDER — PROPOFOL 10 MG/ML IV BOLUS
INTRAVENOUS | Status: DC | PRN
  Administered 2022-02-09: 30 mg via INTRAVENOUS
  Administered 2022-02-09: 150 mg via INTRAVENOUS
  Administered 2022-02-09: 30 mg via INTRAVENOUS
  Administered 2022-02-09: 40 mg via INTRAVENOUS
  Administered 2022-02-09 (×3): 30 mg via INTRAVENOUS

## 2022-02-09 MED ORDER — LIDOCAINE HCL (CARDIAC) PF 100 MG/5ML IV SOSY
PREFILLED_SYRINGE | INTRAVENOUS | Status: DC | PRN
Start: 2022-02-09 — End: 2022-02-09
  Administered 2022-02-09: 30 mg via INTRAVENOUS

## 2022-02-09 MED ORDER — STERILE WATER FOR IRRIGATION IR SOLN
Status: DC | PRN
  Administered 2022-02-09: 250 mL

## 2022-02-09 SURGICAL SUPPLY — 22 items
CLIP HMST 235XBRD CATH ROT (MISCELLANEOUS) IMPLANT
CLIP RESOLUTION 360 11X235 (MISCELLANEOUS)
ELECT REM PT RETURN 9FT ADLT (ELECTROSURGICAL)
ELECTRODE REM PT RTRN 9FT ADLT (ELECTROSURGICAL) IMPLANT
FORCEPS BIOP RAD 4 LRG CAP 4 (CUTTING FORCEPS) IMPLANT
GOWN CVR UNV OPN BCK APRN NK (MISCELLANEOUS) ×4 IMPLANT
GOWN ISOL THUMB LOOP REG UNIV (MISCELLANEOUS) ×6
INJECTOR VARIJECT VIN23 (MISCELLANEOUS) IMPLANT
KIT DEFENDO VALVE AND CONN (KITS) IMPLANT
KIT PRC NS LF DISP ENDO (KITS) ×2 IMPLANT
KIT PROCEDURE OLYMPUS (KITS) ×3
MANIFOLD NEPTUNE II (INSTRUMENTS) ×3 IMPLANT
MARKER SPOT ENDO TATTOO 5ML (MISCELLANEOUS) IMPLANT
PROBE APC STR FIRE (PROBE) IMPLANT
RETRIEVER NET ROTH 2.5X230 LF (MISCELLANEOUS) IMPLANT
SNARE COLD EXACTO (MISCELLANEOUS) IMPLANT
SNARE SHORT THROW 13M SML OVAL (MISCELLANEOUS) IMPLANT
SNARE SNG USE RND 15MM (INSTRUMENTS) IMPLANT
SPOT EX ENDOSCOPIC TATTOO (MISCELLANEOUS)
TRAP ETRAP POLY (MISCELLANEOUS) IMPLANT
VARIJECT INJECTOR VIN23 (MISCELLANEOUS)
WATER STERILE IRR 250ML POUR (IV SOLUTION) ×3 IMPLANT

## 2022-02-09 NOTE — Anesthesia Postprocedure Evaluation (Signed)
Anesthesia Post Note ? ?Patient: Lauren Brady ? ?Procedure(s) Performed: COLONOSCOPY (Rectum) ?POLYPECTOMY (Rectum) ? ? ?  ?Patient location during evaluation: PACU ?Anesthesia Type: General ?Level of consciousness: awake ?Pain management: pain level controlled ?Vital Signs Assessment: post-procedure vital signs reviewed and stable ?Respiratory status: respiratory function stable ?Cardiovascular status: stable ?Postop Assessment: no signs of nausea or vomiting ?Anesthetic complications: no ? ? ?No notable events documented. ? ?Veda Canning ? ? ? ? ? ?

## 2022-02-09 NOTE — H&P (Signed)
? ?Lucilla Lame, MD [facility] ?[address] ?[place], [place] [address] ?Phone: [phone] ?Fax : [phone] ? ?Primary Care Physician:  Lynnell Jude, MD ?Primary Gastroenterologist:  Dr. Allen Norris ? ?Pre-Procedure History & Physical: ?HPI:  Lauren Brady is a 51 y.o. female is here for a screening colonoscopy.  ? ?Past Medical History:  ?Diagnosis Date  ? Genital herpes   ? managed with  medication  ? Obstructive sleep apnea   ? currently uses a CPAP   ? ? ?Past Surgical History:  ?Procedure Laterality Date  ? ABDOMINAL HYSTERECTOMY    ? ABLATION  2002  ? lining of uterus removed 2/2 uncontrolled bleeding  ? GASTRIC BYPASS  2015  ? OOPHORECTOMY  2016  ? L removed due to a cyst   ? ? ?Prior to Admission medications   ?Medication Sig Start Date End Date Taking? Authorizing Provider  ?Cholecalciferol (VITAMIN D3) 2000 units capsule Take by mouth.   Yes [provider]  ?cyclobenzaprine (FLEXERIL) 10 MG tablet Take 1 tablet (10 mg total) by mouth 3 (three) times daily as needed for muscle spasms. 01/18/22  Yes Lucilla Lame, MD  ?Phentermine-Topiramate 3.75-23 MG CP24 Qsymia 3.75 mg-23 mg capsule, extended release ? TAKE ONE CAPSULE BY MOUTH EVERY DAY   Yes [provider]  ?valACYclovir (VALTREX) 500 MG tablet Take 500 mg by mouth 2 (two) times daily.   Yes [provider]  ?omeprazole (PRILOSEC) 20 MG capsule Take 20 mg by mouth daily. ?Patient not taking: Reported on 02/08/2022    [provider]  ? ? ?Allergies as of 02/08/2022 - Review Complete 02/08/2022  ?Allergen Reaction Noted  ? Midol [acetaminophen] Rash 10/26/2016  ? ? ?Family History  ?Problem Relation Age of Onset  ? Diabetes Mother   ? Obesity Mother   ? Cancer Father   ? Diabetes Father   ? Heart disease Father   ? Hyperlipidemia Father   ? ? ?Social History  ? ?Socioeconomic History  ? Marital status: Unknown  ?  Spouse name: Not on file  ? Number of children: Not on file  ? Years of education: Not on file  ? Highest  education level: Not on file  ?Occupational History  ? Not on file  ?Tobacco Use  ? Smoking status: Never  ? Smokeless tobacco: Never  ?Substance and Sexual Activity  ? Alcohol use: No  ? Drug use: No  ? Sexual activity: Yes  ?  Birth control/protection: Surgical  ?Other Topics Concern  ? Not on file  ?Social History Narrative  ? Not on file  ? ?Social Determinants of Health  ? ?Financial Resource Strain: Not on file  ?Food Insecurity: Not on file  ?Transportation Needs: Not on file  ?Physical Activity: Not on file  ?Stress: Not on file  ?Social Connections: Not on file  ?Intimate Partner Violence: Not on file  ? ? ?Review of Systems: ?See HPI, otherwise negative ROS ? ?Physical Exam: ?BP 119/71   Pulse 96   Temp 98.1 ?F (36.7 ?C) (Temporal)   Resp 20   Ht 5\' 3"  (1.6 m)   Wt 99.8 kg   SpO2 96%   BMI 38.97 kg/m?  ?General:   Alert,  pleasant and cooperative in NAD ?Head:  Normocephalic and atraumatic. ?Neck:  Supple; no masses or thyromegaly. ?Lungs:  Clear throughout to auscultation.    ?Heart:  Regular rate and rhythm. ?Abdomen:  Soft, nontender and nondistended. Normal bowel sounds, without guarding, and without rebound.   ?  Neurologic:  Alert and  oriented x4;  grossly normal neurologically. ? ?Impression/Plan: ?[name] is now here to undergo a screening colonoscopy. ? ?Risks, benefits, and alternatives regarding colonoscopy have been reviewed with the patient.  Questions have been answered.  All parties agreeable. ?

## 2022-02-09 NOTE — Transfer of Care (Signed)
Immediate Anesthesia Transfer of Care Note ? ?Patient: Lauren Brady ? ?Procedure(s) Performed: COLONOSCOPY (Rectum) ?POLYPECTOMY (Rectum) ? ?Patient Location: PACU ? ?Anesthesia Type: General ? ?Level of Consciousness: awake, alert  and patient cooperative ? ?Airway and Oxygen Therapy: Patient Spontanous Breathing and Patient connected to supplemental oxygen ? ?Post-op Assessment: Post-op Vital signs reviewed, Patient's Cardiovascular Status Stable, Respiratory Function Stable, Patent Airway and No signs of Nausea or vomiting ? ?Post-op Vital Signs: Reviewed and stable ? ?Complications: No notable events documented. ? ?

## 2022-02-09 NOTE — Anesthesia Preprocedure Evaluation (Signed)
Anesthesia Evaluation  ?Patient identified by MRN, date of birth, ID band ?Patient awake ? ? ? ?Reviewed: ?Allergy & Precautions, NPO status  ? ?Airway ?Mallampati: II ? ?TM Distance: >3 FB ? ? ? ? Dental ?  ?Pulmonary ?sleep apnea and Continuous Positive Airway Pressure Ventilation ,  ?  ?Pulmonary exam normal ? ? ? ? ? ? ? Cardiovascular ?negative cardio ROS ? ? ?Rhythm:Regular Rate:Normal ? ? ?  ?Neuro/Psych ?  ? GI/Hepatic ?hiatal hernia, Hx gastric bypass ?  ?Endo/Other  ?BMI > 30 ? Renal/GU ?  ? ?  ?Musculoskeletal ? ? Abdominal ?  ?Peds ? Hematology ?  ?Anesthesia Other Findings ? ? Reproductive/Obstetrics ? ?  ? ? ? ? ? ? ? ? ? ? ? ? ? ?  ?  ? ? ? ? ? ? ? ? ?Anesthesia Physical ?Anesthesia Plan ? ?ASA: 2 ? ?Anesthesia Plan: General  ? ?Post-op Pain Management:   ? ?Induction: Intravenous ? ?PONV Risk Score and Plan: Propofol infusion, TIVA and Treatment may vary due to age or medical condition ? ?Airway Management Planned: Natural Airway and Nasal Cannula ? ?Additional Equipment:  ? ?Intra-op Plan:  ? ?Post-operative Plan:  ? ?Informed Consent: I have reviewed the patients History and Physical, chart, labs and discussed the procedure including the risks, benefits and alternatives for the proposed anesthesia with the patient or authorized representative who has indicated his/her understanding and acceptance.  ? ? ? ? ? ?Plan Discussed with: CRNA ? ?Anesthesia Plan Comments:   ? ? ? ? ? ? ?Anesthesia Quick Evaluation ? ?

## 2022-02-09 NOTE — Anesthesia Procedure Notes (Signed)
Date/Time: 02/09/2022 7:39 AM ?Performed by: Cameron Ali, CRNA ?Pre-anesthesia Checklist: Patient identified, Emergency Drugs available, Suction available, Timeout performed and Patient being monitored ?Patient Re-evaluated:Patient Re-evaluated prior to induction ?Oxygen Delivery Method: Nasal cannula ?Placement Confirmation: positive ETCO2 ? ? ? ? ?

## 2022-02-09 NOTE — Op Note (Signed)
[facility] ?Gastroenterology ?Patient Name: Lauren Brady ?Procedure Date: 02/09/2022 7:28 AM ?MRN: [id number] ?Account #: [id number] ?Date of Birth: [birth year] ?Admit Type: Outpatient ?Age: 51 ?Room: [facility] OR ROOM 01 ?Gender: Female ?Note Status: Finalized ?Instrument Name: [id number] ?Procedure:             Colonoscopy ?Indications:           Screening for colorectal malignant neoplasm ?Providers:             Lucilla Lame MD, MD ?Referring MD:          Lynnell Jude (Referring MD) ?Medicines:             Propofol per Anesthesia ?Complications:         No immediate complications. ?Procedure:             Pre-Anesthesia Assessment: ?                       - Prior to the procedure, a History and Physical was  ?                       performed, and patient medications and allergies were  ?                       reviewed. The patient's tolerance of previous  ?                       anesthesia was also reviewed. The risks and benefits  ?                       of the procedure and the sedation options and risks  ?                       were discussed with the patient. All questions were  ?                       answered, and informed consent was obtained. Prior  ?                       Anticoagulants: The patient has taken no previous  ?                       anticoagulant or antiplatelet agents. ASA Grade  ?                       Assessment: II - A patient with mild systemic disease.  ?                       After reviewing the risks and benefits, the patient  ?                       was deemed in satisfactory condition to undergo the  ?                       procedure. ?                       After obtaining informed consent, the colonoscope was  ?  passed under direct vision. Throughout the procedure,  ?                       the patient's blood pressure, pulse, and oxygen  ?                       saturations were monitored continuously. The  ?                       Colonoscope was  introduced through the anus and  ?                       advanced to the the cecum, identified by appendiceal  ?                       orifice and ileocecal valve. The colonoscopy was  ?                       performed without difficulty. The patient tolerated  ?                       the procedure well. The quality of the bowel  ?                       preparation was excellent. ?Findings: ?     The perianal and digital rectal examinations were normal. ?     A 3 mm polyp was found in the cecum. The polyp was sessile. The polyp  ?     was removed with a cold biopsy forceps. Resection and retrieval were  ?     complete. ?     Non-bleeding internal hemorrhoids were found during retroflexion. The  ?     hemorrhoids were Grade I (internal hemorrhoids that do not prolapse). ?     A few small-mouthed diverticula were found in the sigmoid colon. ?Impression:            - One 3 mm polyp in the cecum, removed with a cold  ?                       biopsy forceps. Resected and retrieved. ?                       - Non-bleeding internal hemorrhoids. ?                       - Diverticulosis in the sigmoid colon. ?Recommendation:        - Discharge patient to home. ?                       - Resume previous diet. ?                       - Continue present medications. ?                       - Await pathology results. ?                       - If the pathology report reveals adenomatous tissue,  ?  then repeat the colonoscopy for surveillance in 7  ?                       years otherwise 10 years. ?Procedure Code(s):     --- Professional --- ?                       [phone], Colonoscopy, flexible; with biopsy, single or  ?                       multiple ?Diagnosis Code(s):     --- Professional --- ?                       Z12.11, Encounter for screening for malignant neoplasm  ?                       of colon ?                       K63.5, Polyp of colon ?CPT copyright 2019 [employer]. All rights  reserved. ?The codes documented in this report are preliminary and upon coder review may  ?be revised to meet current compliance requirements. ?Lucilla Lame MD, MD ?02/09/2022 7:54:43 AM ?This report has been signed electronically. ?Number of Addenda: 0 ?Note Initiated On: 02/09/2022 7:28 AM ?Scope Withdrawal Time: 0 hours 12 minutes 5 seconds  ?Total Procedure Duration: 0 hours 14 minutes 54 seconds  ?Estimated Blood Loss:  Estimated blood loss: none. ?     [facility] ?

## 2022-02-13 ENCOUNTER — Encounter: Payer: Self-pay | Admitting: Gastroenterology

## 2022-02-13 ENCOUNTER — Other Ambulatory Visit: Payer: Self-pay | Admitting: Gastroenterology

## 2022-02-13 LAB — SURGICAL PATHOLOGY

## 2022-12-04 ENCOUNTER — Other Ambulatory Visit: Payer: Self-pay | Admitting: Family Medicine

## 2022-12-04 DIAGNOSIS — Z1231 Encounter for screening mammogram for malignant neoplasm of breast: Secondary | ICD-10-CM

## 2023-01-01 ENCOUNTER — Ambulatory Visit
Admission: RE | Admit: 2023-01-01 | Discharge: 2023-01-01 | Disposition: A | Discharge status: Home / Self Care | Payer: BC Managed Care – PPO | Source: Ambulatory Visit | Attending: Family Medicine | Admitting: Family Medicine

## 2023-01-01 DIAGNOSIS — Z1231 Encounter for screening mammogram for malignant neoplasm of breast: Secondary | ICD-10-CM | POA: Insufficient documentation

## 2023-01-11 ENCOUNTER — Other Ambulatory Visit: Payer: Self-pay | Admitting: *Deleted

## 2023-01-11 ENCOUNTER — Inpatient Hospital Stay
Admission: RE | Admit: 2023-01-11 | Discharge: 2023-01-11 | Disposition: A | Discharge status: Home / Self Care | Payer: Self-pay | Source: Ambulatory Visit | Attending: *Deleted | Admitting: *Deleted

## 2023-01-11 DIAGNOSIS — Z1231 Encounter for screening mammogram for malignant neoplasm of breast: Secondary | ICD-10-CM

## 2023-12-09 ENCOUNTER — Other Ambulatory Visit: Payer: Self-pay | Admitting: Family Medicine

## 2023-12-09 DIAGNOSIS — Z1231 Encounter for screening mammogram for malignant neoplasm of breast: Secondary | ICD-10-CM

## 2024-01-08 ENCOUNTER — Ambulatory Visit
Admission: RE | Admit: 2024-01-08 | Discharge: 2024-01-08 | Disposition: A | Discharge status: Home / Self Care | Payer: BC Managed Care – PPO | Source: Ambulatory Visit | Attending: Family Medicine | Admitting: Family Medicine

## 2024-01-08 DIAGNOSIS — Z1231 Encounter for screening mammogram for malignant neoplasm of breast: Secondary | ICD-10-CM | POA: Insufficient documentation

## 2024-12-24 ENCOUNTER — Other Ambulatory Visit: Payer: Self-pay | Admitting: Family Medicine

## 2024-12-24 DIAGNOSIS — Z1231 Encounter for screening mammogram for malignant neoplasm of breast: Secondary | ICD-10-CM

## 2025-01-19 ENCOUNTER — Ambulatory Visit
